# Patient Record
Sex: Male | Born: 1995 | Race: White | Hispanic: No | Marital: Single | Smoking: Current some day smoker
Health system: Southern US, Community
[De-identification: ages and names within clinical notes are randomized; demographics above are authoritative.]

## PROBLEM LIST (undated history)

## (undated) DIAGNOSIS — J45909 Unspecified asthma, uncomplicated: Secondary | ICD-10-CM

---

## 2020-08-13 ENCOUNTER — Other Ambulatory Visit: Payer: Self-pay

## 2020-08-13 ENCOUNTER — Ambulatory Visit (HOSPITAL_COMMUNITY)
Admission: EM | Admit: 2020-08-13 | Discharge: 2020-08-13 | Disposition: A | Discharge status: Home / Self Care | Payer: Managed Care, Other (non HMO) | Attending: Emergency Medicine | Admitting: Emergency Medicine

## 2020-08-13 DIAGNOSIS — Z20822 Contact with and (suspected) exposure to covid-19: Secondary | ICD-10-CM | POA: Insufficient documentation

## 2020-08-13 NOTE — ED Triage Notes (Signed)
Pt presents for Coivd test. Denies any symptoms related to COVID.

## 2020-08-14 LAB — SARS CORONAVIRUS 2 (TAT 6-24 HRS): SARS Coronavirus 2: NEGATIVE

## 2021-08-27 ENCOUNTER — Emergency Department (HOSPITAL_COMMUNITY): Payer: 59

## 2021-08-27 ENCOUNTER — Emergency Department (HOSPITAL_COMMUNITY)
Admission: EM | Admit: 2021-08-27 | Discharge: 2021-08-27 | Disposition: A | Discharge status: Home / Self Care | Payer: 59 | Attending: Physician Assistant | Admitting: Physician Assistant

## 2021-08-27 DIAGNOSIS — Y92410 Unspecified street and highway as the place of occurrence of the external cause: Secondary | ICD-10-CM | POA: Insufficient documentation

## 2021-08-27 DIAGNOSIS — M25532 Pain in left wrist: Secondary | ICD-10-CM | POA: Insufficient documentation

## 2021-08-27 DIAGNOSIS — Z5321 Procedure and treatment not carried out due to patient leaving prior to being seen by health care provider: Secondary | ICD-10-CM | POA: Insufficient documentation
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
## 2021-08-27 NOTE — ED Notes (Signed)
Called pt 3x no response °
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
## 2021-08-28 ENCOUNTER — Other Ambulatory Visit: Payer: Self-pay

## 2021-08-28 ENCOUNTER — Emergency Department (HOSPITAL_BASED_OUTPATIENT_CLINIC_OR_DEPARTMENT_OTHER)
Admission: EM | Admit: 2021-08-28 | Discharge: 2021-08-28 | Disposition: A | Discharge status: Home / Self Care | Payer: 59 | Attending: Emergency Medicine | Admitting: Emergency Medicine

## 2021-08-28 ENCOUNTER — Encounter (HOSPITAL_BASED_OUTPATIENT_CLINIC_OR_DEPARTMENT_OTHER): Payer: Self-pay | Admitting: *Deleted

## 2021-08-28 DIAGNOSIS — Y92410 Unspecified street and highway as the place of occurrence of the external cause: Secondary | ICD-10-CM | POA: Insufficient documentation

## 2021-08-28 DIAGNOSIS — F17210 Nicotine dependence, cigarettes, uncomplicated: Secondary | ICD-10-CM | POA: Insufficient documentation

## 2021-08-28 DIAGNOSIS — J45909 Unspecified asthma, uncomplicated: Secondary | ICD-10-CM | POA: Insufficient documentation

## 2021-08-28 DIAGNOSIS — S6992XA Unspecified injury of left wrist, hand and finger(s), initial encounter: Secondary | ICD-10-CM | POA: Diagnosis not present

## 2021-08-28 DIAGNOSIS — S0990XA Unspecified injury of head, initial encounter: Secondary | ICD-10-CM | POA: Diagnosis present

## 2021-08-28 DIAGNOSIS — S0083XA Contusion of other part of head, initial encounter: Secondary | ICD-10-CM | POA: Diagnosis not present

## 2021-08-28 HISTORY — DX: Unspecified asthma, uncomplicated: J45.909
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
## 2021-08-28 NOTE — Discharge Instructions (Addendum)
Your history and exam are consistent with likely soft tissue injury of the hand and the x-ray performed yesterday did not show fracture definitively.  Given the tenderness in the anatomic snuffbox, we do feel is appropriate to have you get a splint and follow-up with hand surgery.  Please use over-the-counter medications to help with her discomfort and please rest.  If any symptoms change or worsen, please return and follow-up with hand doctor

## 2021-10-11 ENCOUNTER — Ambulatory Visit: Payer: Managed Care, Other (non HMO) | Admitting: Nurse Practitioner

## 2022-03-28 ENCOUNTER — Ambulatory Visit: Payer: 59 | Admitting: Nurse Practitioner
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
## 2024-06-07 ENCOUNTER — Emergency Department (HOSPITAL_BASED_OUTPATIENT_CLINIC_OR_DEPARTMENT_OTHER)
Admission: EM | Admit: 2024-06-07 | Discharge: 2024-06-08 | Disposition: A | Discharge status: Home / Self Care | Payer: Self-pay | Attending: Emergency Medicine | Admitting: Emergency Medicine

## 2024-06-07 ENCOUNTER — Encounter (HOSPITAL_BASED_OUTPATIENT_CLINIC_OR_DEPARTMENT_OTHER): Payer: Self-pay

## 2024-06-07 ENCOUNTER — Other Ambulatory Visit: Payer: Self-pay

## 2024-06-07 ENCOUNTER — Emergency Department (HOSPITAL_BASED_OUTPATIENT_CLINIC_OR_DEPARTMENT_OTHER): Payer: Self-pay

## 2024-06-07 DIAGNOSIS — K529 Noninfective gastroenteritis and colitis, unspecified: Secondary | ICD-10-CM | POA: Insufficient documentation

## 2024-06-07 DIAGNOSIS — Z720 Tobacco use: Secondary | ICD-10-CM | POA: Insufficient documentation

## 2024-06-07 DIAGNOSIS — J45909 Unspecified asthma, uncomplicated: Secondary | ICD-10-CM | POA: Insufficient documentation

## 2024-06-07 DIAGNOSIS — D72829 Elevated white blood cell count, unspecified: Secondary | ICD-10-CM | POA: Insufficient documentation

## 2024-06-07 LAB — CBC
HCT: 45.9 % (ref 39.0–52.0)
Hemoglobin: 15.8 g/dL (ref 13.0–17.0)
MCH: 29.9 pg (ref 26.0–34.0)
MCHC: 34.4 g/dL (ref 30.0–36.0)
MCV: 86.8 fL (ref 80.0–100.0)
Platelets: 298 10*3/uL (ref 150–400)
RBC: 5.29 MIL/uL (ref 4.22–5.81)
RDW: 12.7 % (ref 11.5–15.5)
WBC: 14 10*3/uL — ABNORMAL HIGH (ref 4.0–10.5)
nRBC: 0 % (ref 0.0–0.2)

## 2024-06-07 LAB — COMPREHENSIVE METABOLIC PANEL WITH GFR
ALT: 22 U/L (ref 0–44)
AST: 23 U/L (ref 15–41)
Albumin: 4.5 g/dL (ref 3.5–5.0)
Alkaline Phosphatase: 111 U/L (ref 38–126)
Anion gap: 17 — ABNORMAL HIGH (ref 5–15)
BUN: 12 mg/dL (ref 6–20)
CO2: 21 mmol/L — ABNORMAL LOW (ref 22–32)
Calcium: 9.9 mg/dL (ref 8.9–10.3)
Chloride: 100 mmol/L (ref 98–111)
Creatinine, Ser: 0.9 mg/dL (ref 0.61–1.24)
GFR, Estimated: >60 mL/min (ref >60)
Glucose, Bld: 131 mg/dL — ABNORMAL HIGH (ref 70–99)
Potassium: 3.5 mmol/L (ref 3.5–5.1)
Sodium: 138 mmol/L (ref 135–145)
Total Bilirubin: 0.4 mg/dL (ref 0.0–1.2)
Total Protein: 7.6 g/dL (ref 6.5–8.1)

## 2024-06-07 LAB — LIPASE, BLOOD: Lipase: 17 U/L (ref 11–51)

## 2024-06-07 LAB — URINALYSIS, ROUTINE W REFLEX MICROSCOPIC
Bacteria, UA: NONE SEEN
Bilirubin Urine: NEGATIVE
Glucose, UA: NEGATIVE mg/dL
Hgb urine dipstick: NEGATIVE
Ketones, ur: NEGATIVE mg/dL
Leukocytes,Ua: NEGATIVE
Nitrite: NEGATIVE
Specific Gravity, Urine: >1.046 — ABNORMAL HIGH (ref 1.005–1.030)
pH: 7 (ref 5.0–8.0)

## 2024-06-07 MED ORDER — LOPERAMIDE HCL 2 MG PO CAPS: 2.0000 mg | ORAL_CAPSULE | Freq: Four times a day (QID) | ORAL | 0 refills | Status: AC | PRN

## 2024-06-07 MED ORDER — IOHEXOL 300 MG/ML  SOLN
100.0000 mL | Freq: Once | INTRAMUSCULAR | Status: AC | PRN
  Administered 2024-06-07: 100 mL via INTRAVENOUS

## 2024-06-07 MED ORDER — SODIUM CHLORIDE 0.9 % IV BOLUS
1000.0000 mL | Freq: Once | INTRAVENOUS | Status: AC
  Administered 2024-06-07: 1000 mL via INTRAVENOUS

## 2024-06-07 MED ORDER — ONDANSETRON 4 MG PO TBDP
4.0000 mg | ORAL_TABLET | Freq: Once | ORAL | Status: AC | PRN
  Administered 2024-06-07: 4 mg via ORAL
  Filled 2024-06-07: qty 1

## 2024-06-07 MED ORDER — ONDANSETRON 4 MG PO TBDP: 4.0000 mg | ORAL_TABLET | Freq: Three times a day (TID) | ORAL | 0 refills | Status: AC | PRN

## 2024-06-07 NOTE — ED Provider Notes (Signed)
 [place] EMERGENCY DEPARTMENT AT [facility] Provider Note   CSN: [id number] Arrival date & time: 06/07/24  1839     History  Chief Complaint  Patient presents with   Abdominal Pain   Emesis    Nicholas Walters is a 28 y.o. male with a history of asthma presents the ED today for vomiting.  Patient reports intermittent episodes of nausea, vomiting, and diarrhea since drinking a new fruit punch that he got from [employer] 4 days ago with associated generalized abdominal pain.  No pain in the back.  No fevers, chest pain, or shortness of breath.  He said that he noticed some blood when wiping yesterday.  He thinks it could be due to excessive wiping with all the diarrhea he has been having.  Denies any black stool to me, even though the triage note says that he was reporting it then.  States that yesterday he was able to eat and drink as normal but this morning drink more of the fruit punch and then had diarrhea shortly after.  At the time of my evaluation patient reports feeling better.    Home Medications Prior to Admission medications   Medication Sig Start Date End Date Taking? Authorizing Provider  loperamide  (IMODIUM ) 2 MG capsule Take 1 capsule (2 mg total) by mouth 4 (four) times daily as needed for up to 5 days for diarrhea or loose stools. 06/07/24 06/12/24 Yes Sonnie Dusky, PA-C  ondansetron  (ZOFRAN -ODT) 4 MG disintegrating tablet Take 1 tablet (4 mg total) by mouth every 8 (eight) hours as needed for nausea or vomiting. 06/07/24 07/07/24 Yes Sonnie Dusky, PA-C      Allergies    Penicillins    Review of Systems   Review of Systems  Gastrointestinal:  Positive for vomiting.  All other systems reviewed and are negative.   Physical Exam Updated Vital Signs BP (!) 108/59   Pulse (!) 55   Temp 98.6 F (37 C) (Oral)   Resp 19   Ht 5' 7 (1.702 m)   Wt 106.6 kg   SpO2 100%   BMI 36.81 kg/m  Physical Exam Vitals and nursing note reviewed.  Constitutional:       General: He is not in acute distress.    Appearance: Normal appearance.  HENT:     Head: Normocephalic and atraumatic.     Mouth/Throat:     Mouth: Mucous membranes are moist.  Eyes:     Conjunctiva/sclera: Conjunctivae normal.     Pupils: Pupils are equal, round, and reactive to light.  Cardiovascular:     Rate and Rhythm: Normal rate and regular rhythm.     Pulses: Normal pulses.     Heart sounds: Normal heart sounds.  Pulmonary:     Effort: Pulmonary effort is normal.     Breath sounds: Normal breath sounds.  Abdominal:     Palpations: Abdomen is soft.     Tenderness: There is no abdominal tenderness.  Musculoskeletal:        General: Normal range of motion.     Cervical back: Normal range of motion.  Skin:    General: Skin is warm and dry.     Findings: No rash.  Neurological:     General: No focal deficit present.     Mental Status: He is alert.     Motor: No weakness.  Psychiatric:        Mood and Affect: Mood normal.        Behavior: Behavior normal.  ED Results / Procedures / Treatments   Labs (all labs ordered are listed, but only abnormal results are displayed) Labs Reviewed  COMPREHENSIVE METABOLIC PANEL WITH GFR - Abnormal; Notable for the following components:      Result Value   CO2 21 (*)    Glucose, Bld 131 (*)    Anion gap 17 (*)    All other components within normal limits  CBC - Abnormal; Notable for the following components:   WBC 14.0 (*)    All other components within normal limits  URINALYSIS, ROUTINE W REFLEX MICROSCOPIC - Abnormal; Notable for the following components:   Specific Gravity, Urine >1.046 (*)    Protein, ur TRACE (*)    All other components within normal limits  LIPASE, BLOOD    EKG None  Radiology CT ABDOMEN PELVIS W CONTRAST Result Date: 06/07/2024 CLINICAL DATA:  Acute nonlocalized abdominal pain, nausea, vomiting, diarrhea black stools, weakness EXAM: CT ABDOMEN AND PELVIS WITH CONTRAST TECHNIQUE: Multidetector CT  imaging of the abdomen and pelvis was performed using the standard protocol following bolus administration of intravenous contrast. RADIATION DOSE REDUCTION: This exam was performed according to the departmental dose-optimization program which includes automated exposure control, adjustment of the mA and/or kV according to patient size and/or use of iterative reconstruction technique. CONTRAST:  OMNIPAQUE  IOHEXOL  300 MG/ML  SOLN COMPARISON:  None Available. FINDINGS: Lower chest: No acute abnormality. Hepatobiliary: Unremarkable liver. Normal gallbladder. No biliary dilation. Pancreas: Unremarkable. Spleen: Unremarkable. Adrenals/Urinary Tract: Normal adrenal glands. No urinary calculi or hydronephrosis. Bladder is unremarkable. Stomach/Bowel: Normal caliber large and small bowel. No bowel wall thickening. The appendix is normal.Stomach is within normal limits. Vascular/Lymphatic: No significant vascular findings are present. No enlarged abdominal or pelvic lymph nodes. Reproductive: Unremarkable. Other: No free intraperitoneal fluid or air. Musculoskeletal: No acute fracture. IMPRESSION: No acute abnormality in the abdomen or pelvis. Electronically Signed   By: Rozell Cornet M.D.   On: 06/07/2024 19:59    Procedures Procedures    Medications Ordered in ED Medications  ondansetron  (ZOFRAN -ODT) disintegrating tablet 4 mg (4 mg Oral Given 06/07/24 1857)  iohexol  (OMNIPAQUE ) 300 MG/ML solution 100 mL (100 mLs Intravenous Contrast Given 06/07/24 1950)  sodium chloride  0.9 % bolus 1,000 mL (0 mLs Intravenous Stopped 06/07/24 2352)    ED Course/ Medical Decision Making/ A&P                                 Medical Decision Making Amount and/or Complexity of Data Reviewed Labs: ordered. Radiology: ordered.  Risk Prescription drug management.   This patient presents to the ED for concern of vomiting, this involves an extensive number of treatment options, and is a complaint that carries with  it a high risk of complications and morbidity.   Differential diagnosis includes: gastroenteritis, gastritis, colitis, CHS, viral illness, etc.   Comorbidities  See HPI above   Additional History  Additional history obtained from prior records   Lab Tests  I ordered and personally interpreted labs.  The pertinent results include:    UA shows no signs of infection CMP shows elevated anion gap at 17, otherwise reassuring Lipase unremarkable Elevated WBC 14.0, otherwise unremarkable - likely due to vomiting/dehydration   Imaging Studies  I ordered imaging studies including CT abdomen/pelvis  I independently visualized and interpreted imaging which showed:  No acute abnormality in the abdomen or pelvis. I agree with the radiologist interpretation  Problem List / ED Course / Critical Interventions / Medication Management  Patient reports having episodes of vomiting and diarrhea for the past 4 days.  Symptoms for started after drinking a new fruit punch that he bought.  Denies any alcohol or marijuana use. No fevers at home.  He has been able to eat and drink normally yesterday but drank more the juice this morning and has been having intermittent episodes of vomiting and diarrhea since as well as generalized abdominal pain.  Denies any abdominal pain at this time. In triage he was noted that he reports having black stools, tells me that he noticed blood with wiping yesterday.  Denies any rectal pain or blood in stool today. No blood with wiping today. No dizziness or weakness. He thinks it was from excessive wiping from all his diarrhea recently. I ordered medications including: Zofran  and NS for vomiting  Reevaluation of the patient after these medicines showed that the patient improved.   Patient tolerated p.o. challenge - no N/V/D after eating in the ED. Prescriptions for Zofran  and Imodium  sent to the pharmacy.   Social Determinants of Health  Tobacco use   Test /  Admission - Considered  Discussed findings with patient. All questions were answered.  He is stable and safe for discharge home. Return precautions given.       Final Clinical Impression(s) / ED Diagnoses Final diagnoses:  Gastroenteritis    Rx / DC Orders ED Discharge Orders          Ordered    ondansetron  (ZOFRAN -ODT) 4 MG disintegrating tablet  Every 8 hours PRN        06/07/24 2316    loperamide  (IMODIUM ) 2 MG capsule  4 times daily PRN        06/07/24 2316              Sonnie Dusky, PA-C 06/08/24 0023    Scarlette Currier, MD 06/10/24 2322

## 2024-06-07 NOTE — ED Provider Notes (Incomplete)
 [place] EMERGENCY DEPARTMENT AT [facility] Provider Note   CSN: [id number] Arrival date & time: 06/07/24  1839     History {Add pertinent medical, surgical, social history, OB history to HPI:1} Chief Complaint  Patient presents with  . Rectal Bleeding  . Abdominal Pain  . Emesis    Nicholas Walters is a 28 y.o. male .   Rectal Bleeding Associated symptoms: abdominal pain and vomiting   Abdominal Pain Associated symptoms: hematochezia and vomiting   Emesis Associated symptoms: abdominal pain        Home Medications Prior to Admission medications   Not on File      Allergies    Penicillins    Review of Systems   Review of Systems  Gastrointestinal:  Positive for abdominal pain, hematochezia and vomiting.    Physical Exam Updated Vital Signs BP (!) 134/99   Pulse 68   Temp 98.6 F (37 C) (Oral)   Resp 19   Ht 5\' 7"  (1.702 m)   Wt 106.6 kg   SpO2 100%   BMI 36.81 kg/m  Physical Exam  ED Results / Procedures / Treatments   Labs (all labs ordered are listed, but only abnormal results are displayed) Labs Reviewed  COMPREHENSIVE METABOLIC PANEL WITH GFR - Abnormal; Notable for the following components:      Result Value   CO2 21 (*)    Glucose, Bld 131 (*)    Anion gap 17 (*)    All other components within normal limits  CBC - Abnormal; Notable for the following components:   WBC 14.0 (*)    All other components within normal limits  URINALYSIS, ROUTINE W REFLEX MICROSCOPIC - Abnormal; Notable for the following components:   Specific Gravity, Urine >1.046 (*)    Protein, ur TRACE (*)    All other components within normal limits  LIPASE, BLOOD    EKG None  Radiology CT ABDOMEN PELVIS W CONTRAST Result Date: 06/07/2024 CLINICAL DATA:  Acute nonlocalized abdominal pain, nausea, vomiting, diarrhea black stools, weakness EXAM: CT ABDOMEN AND PELVIS WITH CONTRAST TECHNIQUE: Multidetector CT imaging of the abdomen and pelvis was performed  using the standard protocol following bolus administration of intravenous contrast. RADIATION DOSE REDUCTION: This exam was performed according to the departmental dose-optimization program which includes automated exposure control, adjustment of the mA and/or kV according to patient size and/or use of iterative reconstruction technique. CONTRAST:  100mL OMNIPAQUE IOHEXOL 300 MG/ML  SOLN COMPARISON:  None Available. FINDINGS: Lower chest: No acute abnormality. Hepatobiliary: Unremarkable liver. Normal gallbladder. No biliary dilation. Pancreas: Unremarkable. Spleen: Unremarkable. Adrenals/Urinary Tract: Normal adrenal glands. No urinary calculi or hydronephrosis. Bladder is unremarkable. Stomach/Bowel: Normal caliber large and small bowel. No bowel wall thickening. The appendix is normal.Stomach is within normal limits. Vascular/Lymphatic: No significant vascular findings are present. No enlarged abdominal or pelvic lymph nodes. Reproductive: Unremarkable. Other: No free intraperitoneal fluid or air. Musculoskeletal: No acute fracture. IMPRESSION: No acute abnormality in the abdomen or pelvis. Electronically Signed   By: Rozell Cornet M.D.   On: 06/07/2024 19:59    Procedures Procedures  {Document cardiac monitor, telemetry assessment procedure when appropriate:1}  Medications Ordered in ED Medications  ondansetron (ZOFRAN-ODT) disintegrating tablet 4 mg (4 mg Oral Given 06/07/24 1857)  iohexol (OMNIPAQUE) 300 MG/ML solution 100 mL (100 mLs Intravenous Contrast Given 06/07/24 1950)  sodium chloride 0.9 % bolus 1,000 mL (1,000 mLs Intravenous New Bag/Given 06/07/24 2237)    ED Course/ Medical Decision Making/ A&P   {  Click here for ABCD2, HEART and other calculatorsREFRESH Note before signing :1}                              Medical Decision Making Amount and/or Complexity of Data Reviewed Labs: ordered. Radiology: ordered.  Risk Prescription drug management.   ***  {Document critical  care time when appropriate:1} {Document review of labs and clinical decision tools ie heart score, Chads2Vasc2 etc:1}  {Document your independent review of radiology images, and any outside records:1} {Document your discussion with family members, caretakers, and with consultants:1} {Document social determinants of health affecting pt's care:1} {Document your decision making why or why not admission, treatments were needed:1} Final Clinical Impression(s) / ED Diagnoses Final diagnoses:  None    Rx / DC Orders ED Discharge Orders     None

## 2024-06-07 NOTE — ED Notes (Signed)
 Pt removed IV. Gauze and tape applied. Provided snack for PO challenge.

## 2024-06-07 NOTE — ED Triage Notes (Signed)
 Pt POV reporting abd pain and nvd since Sat, now noting black stools. Endorsing weakness, unable to keep anything down, thinks he has food poisoning.

## 2024-06-07 NOTE — Discharge Instructions (Addendum)
 As discussed, your labs are signs of dehydration.  Otherwise your testing is reassuring.  With a prescription of Zofran to the pharmacy you can take every 8 hours as needed for nausea/vomiting.  Take Imodium every 6 hours as needed for diarrhea.  Eat a bland diet for the next several days to prevent worsening vomiting and diarrhea.  Follow-up with your PCP in the next week for reevaluation.  Return to the ED if symptoms worsen in the interim.

## 2024-06-08 NOTE — ED Notes (Signed)
 Pt out of ED with steady gait with all belongings and paperwork.
